# Patient Record
Sex: Male | Born: 2000 | Race: Black or African American | Hispanic: No | Marital: Single | Smoking: Never smoker
Health system: Southern US, Community
[De-identification: ages and names within clinical notes are randomized; demographics above are authoritative.]

## PROBLEM LIST (undated history)

## (undated) DIAGNOSIS — R040 Epistaxis: Secondary | ICD-10-CM

---
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
## 2020-12-12 ENCOUNTER — Emergency Department (HOSPITAL_BASED_OUTPATIENT_CLINIC_OR_DEPARTMENT_OTHER): Payer: BC Managed Care – PPO | Admitting: Radiology

## 2020-12-12 ENCOUNTER — Emergency Department (HOSPITAL_BASED_OUTPATIENT_CLINIC_OR_DEPARTMENT_OTHER)
Admission: EM | Admit: 2020-12-12 | Discharge: 2020-12-12 | Disposition: A | Discharge status: Home / Self Care | Payer: BC Managed Care – PPO | Attending: Emergency Medicine | Admitting: Emergency Medicine

## 2020-12-12 ENCOUNTER — Encounter (HOSPITAL_BASED_OUTPATIENT_CLINIC_OR_DEPARTMENT_OTHER): Payer: Self-pay | Admitting: Obstetrics and Gynecology

## 2020-12-12 ENCOUNTER — Other Ambulatory Visit: Payer: Self-pay

## 2020-12-12 DIAGNOSIS — R0789 Other chest pain: Secondary | ICD-10-CM | POA: Diagnosis not present

## 2020-12-12 DIAGNOSIS — R079 Chest pain, unspecified: Secondary | ICD-10-CM | POA: Diagnosis present

## 2020-12-12 LAB — BASIC METABOLIC PANEL
Anion gap: 10 (ref 5–15)
BUN: 12 mg/dL (ref 6–20)
CO2: 25 mmol/L (ref 22–32)
Calcium: 9.7 mg/dL (ref 8.9–10.3)
Chloride: 104 mmol/L (ref 98–111)
Creatinine, Ser: 0.84 mg/dL (ref 0.61–1.24)
GFR, Estimated: >60 mL/min (ref >60)
Glucose, Bld: 90 mg/dL (ref 70–99)
Potassium: 3.9 mmol/L (ref 3.5–5.1)
Sodium: 139 mmol/L (ref 135–145)

## 2020-12-12 LAB — CBC
HCT: 46.6 % (ref 39.0–52.0)
Hemoglobin: 15.5 g/dL (ref 13.0–17.0)
MCH: 29.2 pg (ref 26.0–34.0)
MCHC: 33.3 g/dL (ref 30.0–36.0)
MCV: 87.9 fL (ref 80.0–100.0)
Platelets: 235 10*3/uL (ref 150–400)
RBC: 5.3 MIL/uL (ref 4.22–5.81)
RDW: 12 % (ref 11.5–15.5)
WBC: 4.9 10*3/uL (ref 4.0–10.5)
nRBC: 0 % (ref 0.0–0.2)

## 2020-12-12 LAB — TROPONIN I (HIGH SENSITIVITY): Troponin I (High Sensitivity): <2 ng/L (ref <18)

## 2020-12-12 MED ORDER — KETOROLAC TROMETHAMINE 30 MG/ML IJ SOLN
30.0000 mg | Freq: Once | INTRAMUSCULAR | Status: AC
  Administered 2020-12-12: 30 mg via INTRAVENOUS
  Filled 2020-12-12: qty 1

## 2020-12-12 NOTE — ED Provider Notes (Signed)
[facility] EMERGENCY DEPT Provider Note   CSN: [id number] Arrival date & time: 12/12/20  1300     History Chief Complaint  Patient presents with  . Chest Pain    Mathew Nunez is a 20 y.o. male.   Chest Pain Pain location:  L chest Pain quality: aching   Pain radiates to:  Does not radiate Pain severity:  Mild Onset quality:  Gradual Timing:  Intermittent Progression:  Waxing and waning Chronicity:  New Context: raising an arm   Relieved by:  Nothing Worsened by:  Movement Associated symptoms: no abdominal pain, no anorexia, no anxiety, no back pain, no claudication, no cough, no dizziness, no fever, no headache, no heartburn, no palpitations, no shortness of breath and no vomiting   Risk factors: no coronary artery disease, no high cholesterol and no hypertension        Past Medical History:  Diagnosis Date  . Epistaxis     There are no problems to display for this patient.   History reviewed. No pertinent surgical history.     No family history on file.  Social History   Tobacco Use  . Smoking status: Never Smoker  . Smokeless tobacco: Never Used  Vaping Use  . Vaping Use: Never used  Substance Use Topics  . Alcohol use: No  . Drug use: Yes    Frequency: 3.0 times per week    Types: Marijuana    Comment: everyday    Home Medications Prior to Admission medications   Medication Sig Start Date End Date Taking? Authorizing Provider  cyclobenzaprine (FLEXERIL) 10 MG tablet Take 1 tablet (10 mg total) by mouth 2 (two) times daily as needed for muscle spasms. 06/01/19   Renne Crigler, PA-C  famotidine (PEPCID) 20 MG tablet Take 1 tablet (20 mg total) by mouth 2 (two) times daily. Patient not taking: Reported on 06/01/2019 10/31/17   Palumbo, April, MD  naproxen (NAPROSYN) 500 MG tablet Take 1 tablet (500 mg total) by mouth 2 (two) times daily. 06/01/19   Renne Crigler, PA-C    Allergies    Patient has no known  allergies.  Review of Systems   Review of Systems  Constitutional: Negative for chills and fever.  HENT: Negative for ear pain and sore throat.   Eyes: Negative for pain and visual disturbance.  Respiratory: Negative for cough and shortness of breath.   Cardiovascular: Positive for chest pain. Negative for palpitations and claudication.  Gastrointestinal: Negative for abdominal pain, anorexia, heartburn and vomiting.  Genitourinary: Negative for dysuria and hematuria.  Musculoskeletal: Negative for arthralgias and back pain.  Skin: Negative for color change and rash.  Neurological: Negative for dizziness, seizures, syncope and headaches.  All other systems reviewed and are negative.   Physical Exam Updated Vital Signs BP 112/82 (BP Location: Left Arm)   Pulse 69   Temp 98.6 F (37 C) (Oral)   Resp 14   Ht 5\' 9"  (1.753 m)   Wt 65.8 kg   SpO2 100%   BMI 21.41 kg/m   Physical Exam Vitals and nursing note reviewed.  Constitutional:      Appearance: He is well-developed.  HENT:     Head: Normocephalic and atraumatic.  Eyes:     Conjunctiva/sclera: Conjunctivae normal.     Pupils: Pupils are equal, round, and reactive to light.  Cardiovascular:     Rate and Rhythm: Normal rate and regular rhythm.     Pulses:  Radial pulses are 2+ on the right side and 2+ on the left side.     Heart sounds: No murmur heard.   Pulmonary:     Effort: Pulmonary effort is normal. No respiratory distress.     Breath sounds: Normal breath sounds. No decreased breath sounds or wheezing.  Chest:     Chest wall: Tenderness present.  Abdominal:     Palpations: Abdomen is soft.     Tenderness: There is no abdominal tenderness.  Musculoskeletal:        General: Normal range of motion.     Cervical back: Normal range of motion and neck supple.     Right lower leg: No edema.     Left lower leg: No edema.  Skin:    General: Skin is warm and dry.     Capillary Refill: Capillary refill  takes less than 2 seconds.  Neurological:     General: No focal deficit present.     Mental Status: He is alert.     ED Results / Procedures / Treatments   Labs (all labs ordered are listed, but only abnormal results are displayed) Labs Reviewed  BASIC METABOLIC PANEL  CBC  TROPONIN I (HIGH SENSITIVITY)    EKG EKG Interpretation  Date/Time:  Wednesday December 12 2020 13:10:43 EDT Ventricular Rate:  65 PR Interval:  146 QRS Duration: 92 QT Interval:  358 QTC Calculation: 372 R Axis:   68 Text Interpretation: Normal sinus rhythm Confirmed by Virgina Norfolk ([id number]) on 12/12/2020 3:01:56 PM   Radiology DG Chest 2 View  Result Date: 12/12/2020 CLINICAL DATA:  Left chest pain since Sunday with worsening. EXAM: CHEST - 2 VIEW COMPARISON:  None. FINDINGS: Trachea is midline. Heart size normal. Lungs are clear. No pleural fluid. IMPRESSION: Negative. Electronically Signed   By: Leanna Battles M.D.   On: 12/12/2020 13:53    Procedures Procedures   Medications Ordered in ED Medications  ketorolac (TORADOL) 30 MG/ML injection 30 mg (has no administration in time range)    ED Course  I have reviewed the triage vital signs and the nursing notes.  Pertinent labs & imaging results that were available during my care of the patient were reviewed by me and considered in my medical decision making (see chart for details).    MDM Rules/Calculators/A&P                          Mathew Nunez is here with left-sided chest pain.  Reproducible.  Normal vitals.  No fever.  Troponin normal.  EKG with no ischemic changes.  Chest x-ray with no rib fracture or pneumothorax.  No significant anemia, electrolyte abnormality, kidney injury.  Overall suspect musculoskeletal pain as he did a new workout over the weekend.  Given a dose of Toradol prior to discharge.  Recommend Tylenol and ibuprofen.  This chart was dictated using voice recognition software.  Despite best efforts to proofread,  errors  can occur which can change the documentation meaning.   Final Clinical Impression(s) / ED Diagnoses Final diagnoses:  Chest wall pain    Rx / DC Orders ED Discharge Orders    None       Virgina Norfolk, DO 12/12/20 1514

## 2020-12-12 NOTE — Discharge Instructions (Addendum)
Overall suspect musculoskeletal pain.  Recommend 800 mg ibuprofen every 8 hours as needed for pain.  Recommend 1000 mg of Tylenol every 6 hours as needed for pain.

## 2020-12-12 NOTE — ED Triage Notes (Signed)
Patient reports to the ER for chest pain. Patient reports he had some cardiac problems in school with skipping beats and SVT and had to stop playing sports for a bit. Patient reports he is unsure whether he had an echo or stress test done due to his age at the time.

## 2021-12-29 IMAGING — DX DG CHEST 2V
2 series · 2 of 2 positions shown · non-contrast
Comparison: None.

CLINICAL DATA: Left chest pain since [REDACTED] with worsening.

EXAM:
CHEST - 2 VIEW

[chest pa]
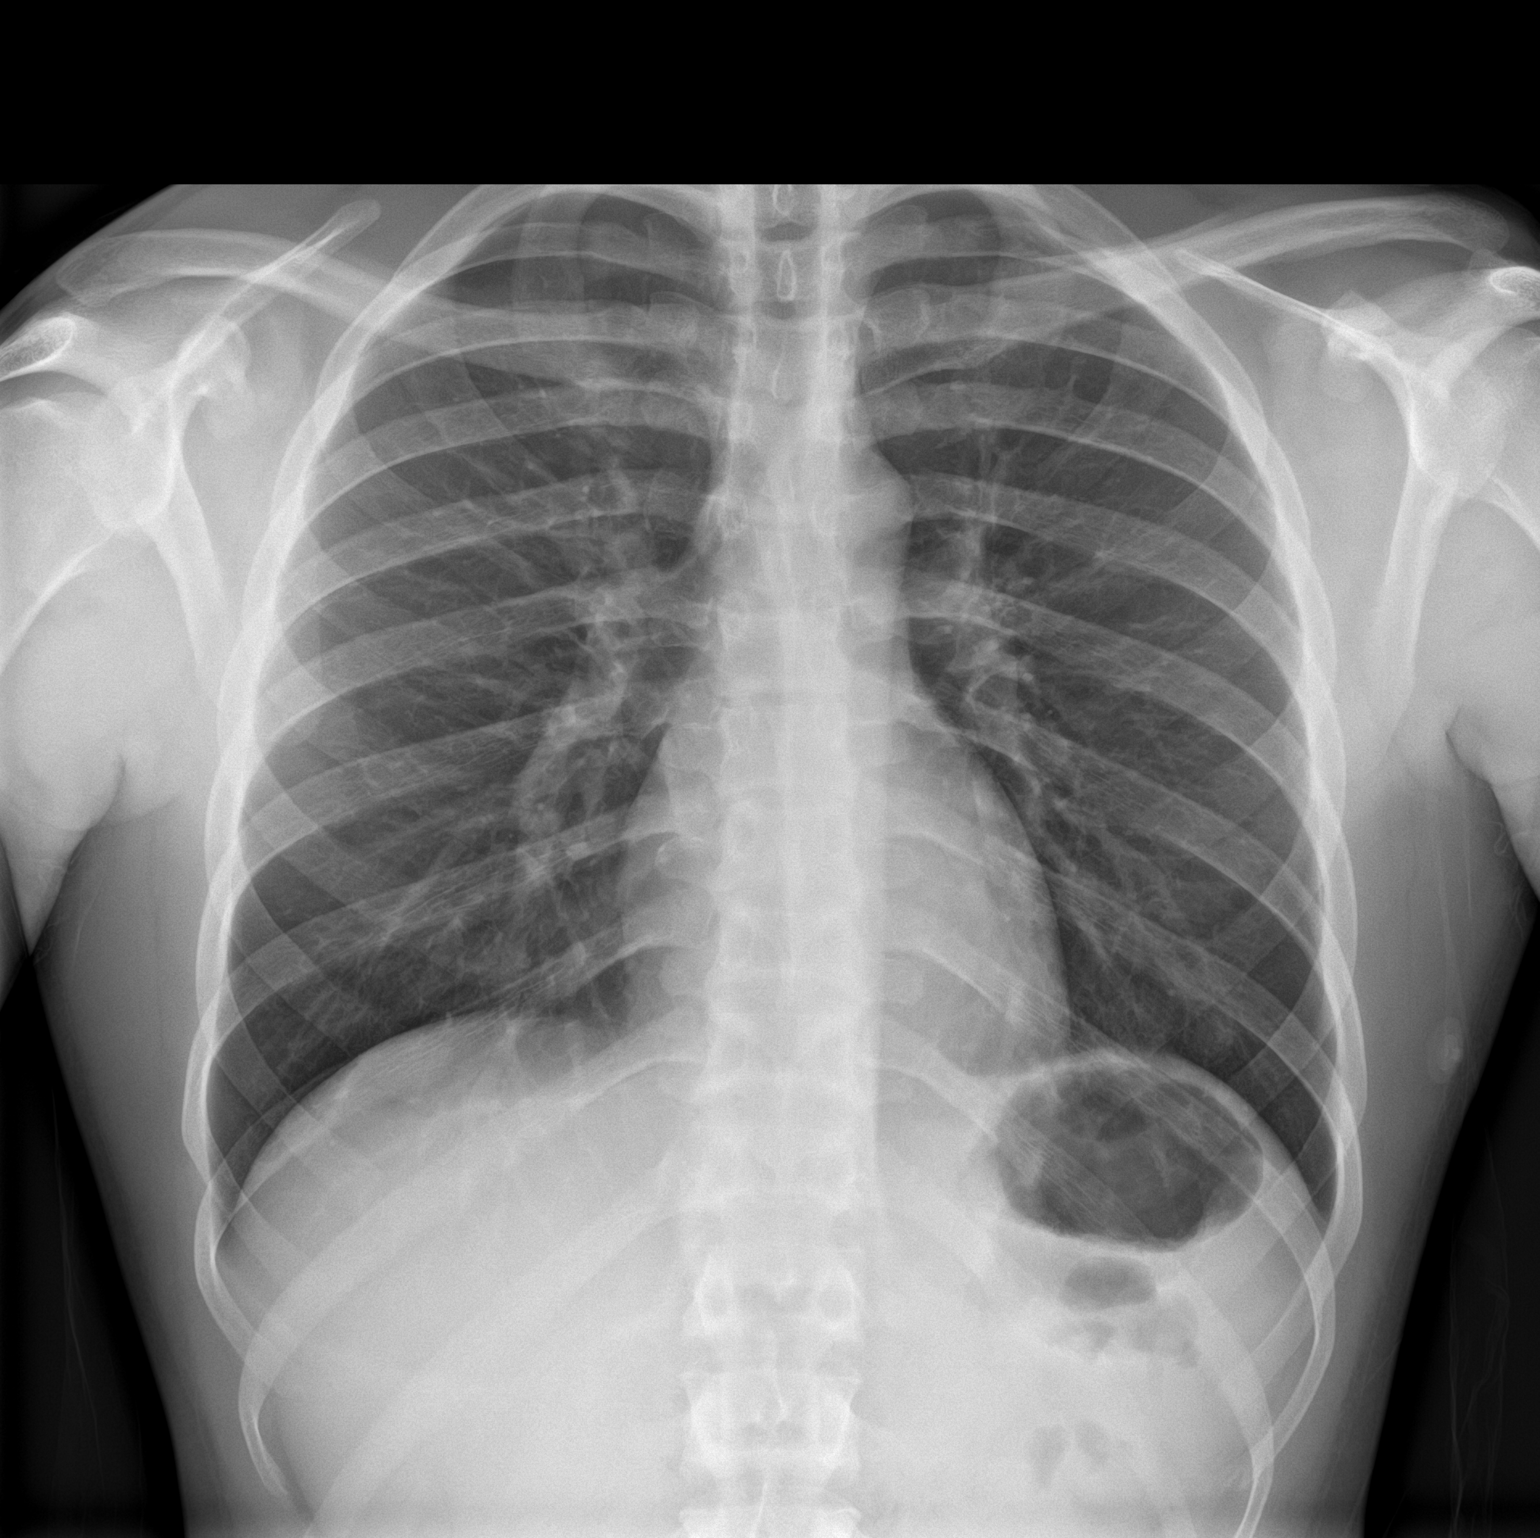

[chest lat]
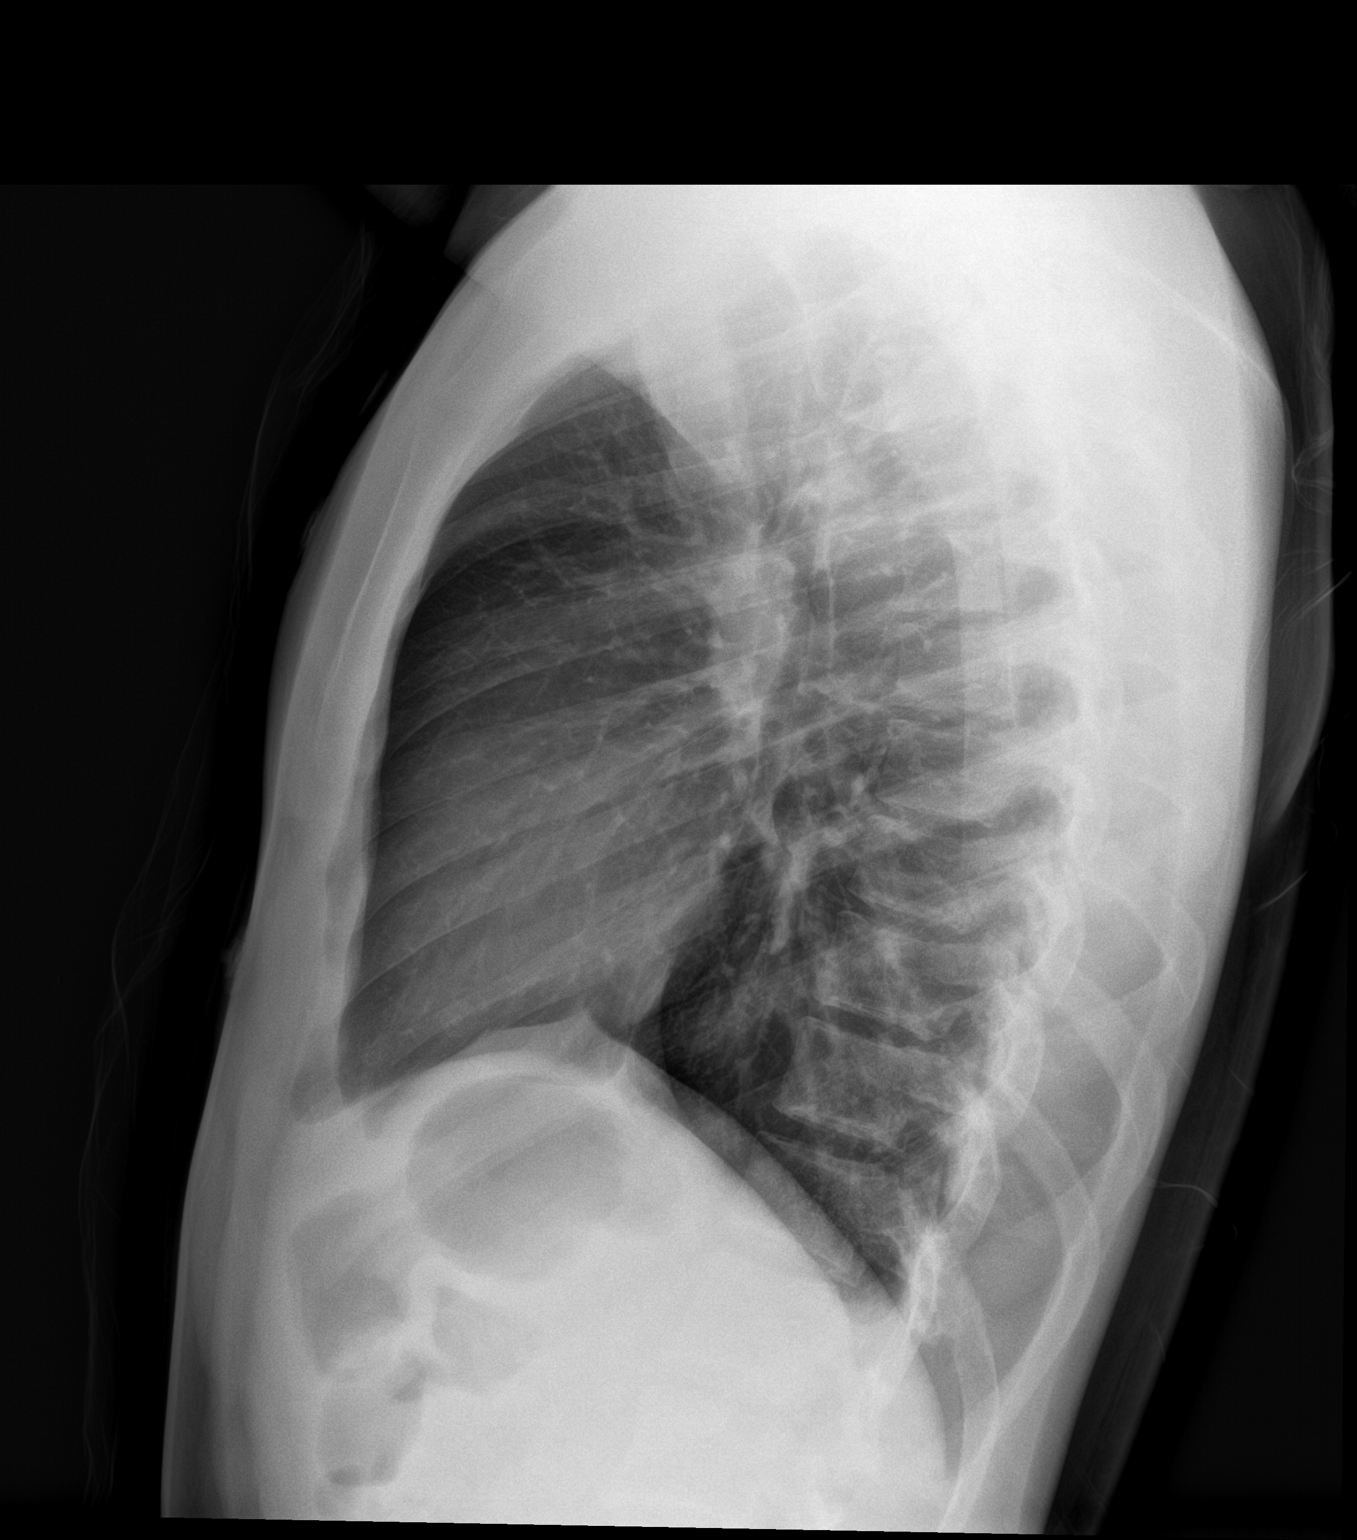

[2 of 2 positions shown; findings below may reference images not displayed]

FINDINGS: Trachea is midline. Heart size normal. Lungs are clear. No pleural
fluid.
IMPRESSION: Negative.

## 2022-01-28 ENCOUNTER — Emergency Department (HOSPITAL_BASED_OUTPATIENT_CLINIC_OR_DEPARTMENT_OTHER)
Admission: EM | Admit: 2022-01-28 | Discharge: 2022-01-28 | Disposition: A | Discharge status: Home / Self Care | Payer: Medicaid Other | Attending: Emergency Medicine | Admitting: Emergency Medicine

## 2022-01-28 ENCOUNTER — Other Ambulatory Visit: Payer: Self-pay

## 2022-01-28 ENCOUNTER — Encounter (HOSPITAL_BASED_OUTPATIENT_CLINIC_OR_DEPARTMENT_OTHER): Payer: Self-pay | Admitting: Obstetrics and Gynecology

## 2022-01-28 DIAGNOSIS — U071 COVID-19: Secondary | ICD-10-CM | POA: Diagnosis not present

## 2022-01-28 DIAGNOSIS — R519 Headache, unspecified: Secondary | ICD-10-CM | POA: Diagnosis present

## 2022-01-28 LAB — COMPREHENSIVE METABOLIC PANEL
ALT: 23 U/L (ref 0–44)
AST: 22 U/L (ref 15–41)
Albumin: 4.6 g/dL (ref 3.5–5.0)
Alkaline Phosphatase: 59 U/L (ref 38–126)
Anion gap: 7 (ref 5–15)
BUN: 8 mg/dL (ref 6–20)
CO2: 27 mmol/L (ref 22–32)
Calcium: 9.8 mg/dL (ref 8.9–10.3)
Chloride: 96 mmol/L — ABNORMAL LOW (ref 98–111)
Creatinine, Ser: 1.24 mg/dL (ref 0.61–1.24)
GFR, Estimated: >60 mL/min (ref >60)
Glucose, Bld: 90 mg/dL (ref 70–99)
Potassium: 4.3 mmol/L (ref 3.5–5.1)
Sodium: 130 mmol/L — ABNORMAL LOW (ref 135–145)
Total Bilirubin: 0.5 mg/dL (ref 0.3–1.2)
Total Protein: 7.6 g/dL (ref 6.5–8.1)

## 2022-01-28 LAB — URINALYSIS, ROUTINE W REFLEX MICROSCOPIC
Bilirubin Urine: NEGATIVE
Glucose, UA: NEGATIVE mg/dL
Hgb urine dipstick: NEGATIVE
Ketones, ur: 40 mg/dL — AB
Leukocytes,Ua: NEGATIVE
Nitrite: NEGATIVE
Protein, ur: 30 mg/dL — AB
Specific Gravity, Urine: 1.028 (ref 1.005–1.030)
pH: 7.5 (ref 5.0–8.0)

## 2022-01-28 LAB — CBC WITH DIFFERENTIAL/PLATELET
Abs Immature Granulocytes: 0.01 10*3/uL (ref 0.00–0.07)
Basophils Absolute: 0 10*3/uL (ref 0.0–0.1)
Basophils Relative: 0 %
Eosinophils Absolute: 0 10*3/uL (ref 0.0–0.5)
Eosinophils Relative: 0 %
HCT: 47.4 % (ref 39.0–52.0)
Hemoglobin: 16.2 g/dL (ref 13.0–17.0)
Immature Granulocytes: 0 %
Lymphocytes Relative: 5 %
Lymphs Abs: 0.3 10*3/uL — ABNORMAL LOW (ref 0.7–4.0)
MCH: 30.1 pg (ref 26.0–34.0)
MCHC: 34.2 g/dL (ref 30.0–36.0)
MCV: 88.1 fL (ref 80.0–100.0)
Monocytes Absolute: 0.8 10*3/uL (ref 0.1–1.0)
Monocytes Relative: 14 %
Neutro Abs: 4.6 10*3/uL (ref 1.7–7.7)
Neutrophils Relative %: 81 %
Platelets: 197 10*3/uL (ref 150–400)
RBC: 5.38 MIL/uL (ref 4.22–5.81)
RDW: 12 % (ref 11.5–15.5)
WBC: 5.7 10*3/uL (ref 4.0–10.5)
nRBC: 0 % (ref 0.0–0.2)

## 2022-01-28 LAB — RESP PANEL BY RT-PCR (FLU A&B, COVID) ARPGX2
Influenza A by PCR: NEGATIVE
Influenza B by PCR: NEGATIVE
SARS Coronavirus 2 by RT PCR: POSITIVE — AB

## 2022-01-28 LAB — GROUP A STREP BY PCR: Group A Strep by PCR: NOT DETECTED

## 2022-01-28 MED ORDER — KETOROLAC TROMETHAMINE 15 MG/ML IJ SOLN
15.0000 mg | Freq: Once | INTRAMUSCULAR | Status: AC
  Administered 2022-01-28: 15 mg via INTRAVENOUS
  Filled 2022-01-28: qty 1

## 2022-01-28 MED ORDER — LACTATED RINGERS IV BOLUS
1000.0000 mL | Freq: Once | INTRAVENOUS | Status: AC
  Administered 2022-01-28: 1000 mL via INTRAVENOUS

## 2022-01-28 MED ORDER — OXYCODONE-ACETAMINOPHEN 5-325 MG PO TABS
1.0000 | ORAL_TABLET | ORAL | Status: DC | PRN
  Administered 2022-01-28: 1 via ORAL
  Filled 2022-01-28: qty 1

## 2022-01-28 MED ORDER — METOCLOPRAMIDE HCL 5 MG/ML IJ SOLN
10.0000 mg | Freq: Once | INTRAMUSCULAR | Status: AC
  Administered 2022-01-28: 10 mg via INTRAVENOUS
  Filled 2022-01-28: qty 2

## 2022-01-28 MED ORDER — ONDANSETRON 4 MG PO TBDP
4.0000 mg | ORAL_TABLET | Freq: Once | ORAL | Status: AC | PRN
  Administered 2022-01-28: 4 mg via ORAL
  Filled 2022-01-28: qty 1

## 2022-01-28 MED ORDER — ONDANSETRON HCL 4 MG PO TABS: 4.0000 mg | ORAL_TABLET | Freq: Four times a day (QID) | ORAL | 0 refills | Status: DC

## 2022-01-28 MED ORDER — DEXAMETHASONE SODIUM PHOSPHATE 10 MG/ML IJ SOLN
10.0000 mg | Freq: Once | INTRAMUSCULAR | Status: AC
  Administered 2022-01-28: 10 mg via INTRAVENOUS
  Filled 2022-01-28: qty 1

## 2022-01-28 NOTE — ED Provider Notes (Signed)
[facility] EMERGENCY DEPT Provider Note   CSN: [id number] Arrival date & time: 01/28/22  1335     History Chief Complaint  Patient presents with   Headache   Emesis    Mathew Nunez is a 21 y.o. male with history of hyponatremia presents to the emergency department for evaluation of headache, body aches, nausea, vomiting, chills, rhinorrhea since around last night/0700 this morning.  Patient reports that he was out this weekend partying with friends and family.  He does not think that he is hung over as he has felt well after the partying.  He reports that he has a diffuse headache as well as diffuse body aches.  He denies any neck pain or neck stiffness.  Denies any fever, nasal congestion, sore throat, abdominal pain, chest pain, shortness of breath, or cough.  He took some Benadryl prior to arrival but denies trying any other medications.  The patient reports he feels diffusely fatigued.  He denies any blurry vision.  Denies that this is the worst headache of his life.  He denies any other medical conditions, denies any surgeries.  No daily medications.  No known drug allergies.  Daily marijuana user.   Headache Associated symptoms: fatigue, myalgias, nausea and vomiting   Associated symptoms: no abdominal pain, no back pain, no congestion, no cough, no diarrhea, no dizziness, no fever, no neck pain and no sore throat   Emesis Associated symptoms: chills, headaches and myalgias   Associated symptoms: no abdominal pain, no cough, no diarrhea, no fever and no sore throat        Home Medications Prior to Admission medications   Medication Sig Start Date End Date Taking? Authorizing Provider  ondansetron (ZOFRAN) 4 MG tablet Take 1 tablet (4 mg total) by mouth every 6 (six) hours. 01/28/22  Yes Achille Rich, PA-C  cyclobenzaprine (FLEXERIL) 10 MG tablet Take 1 tablet (10 mg total) by mouth 2 (two) times daily as needed for muscle spasms. 06/01/19   Renne Crigler,  PA-C  famotidine (PEPCID) 20 MG tablet Take 1 tablet (20 mg total) by mouth 2 (two) times daily. Patient not taking: Reported on 06/01/2019 10/31/17   Palumbo, April, MD  naproxen (NAPROSYN) 500 MG tablet Take 1 tablet (500 mg total) by mouth 2 (two) times daily. 06/01/19   Renne Crigler, PA-C      Allergies    Patient has no known allergies.    Review of Systems   Review of Systems  Constitutional:  Positive for chills and fatigue. Negative for fever.  HENT:  Positive for rhinorrhea. Negative for congestion and sore throat.   Respiratory:  Negative for cough and shortness of breath.   Cardiovascular:  Negative for chest pain.  Gastrointestinal:  Positive for nausea and vomiting. Negative for abdominal pain, constipation and diarrhea.  Musculoskeletal:  Positive for myalgias. Negative for back pain and neck pain.  Neurological:  Positive for headaches. Negative for dizziness, syncope and light-headedness.    Physical Exam Updated Vital Signs BP 121/74 (BP Location: Right Arm)   Pulse 81   Temp 98.1 F (36.7 C) (Oral)   Resp 16   Ht 5\' 9"  (1.753 m)   Wt 74.8 kg   SpO2 99%   BMI 24.37 kg/m  Physical Exam Vitals and nursing note reviewed.  Constitutional:      General: He is not in acute distress.    Appearance: He is not ill-appearing or toxic-appearing.  HENT:     Head: Normocephalic and atraumatic.  Right Ear: Tympanic membrane, ear canal and external ear normal.     Left Ear: Tympanic membrane, ear canal and external ear normal.     Nose:     Comments: Bilateral nasal turbinate edema and erythema with scant clear nasal discharge.    Mouth/Throat:     Mouth: Mucous membranes are moist.     Comments: No pharyngeal erythema, exudate, or edema noted.  Uvula midline.  Airway patent.  Moist mucous membranes. Eyes:     General: No scleral icterus.    Extraocular Movements: Extraocular movements intact.     Conjunctiva/sclera: Conjunctivae normal.     Pupils: Pupils are  equal, round, and reactive to light.  Neck:     Comments: Full range of motion without pain.  No cervical lymphadenopathy. Cardiovascular:     Rate and Rhythm: Normal rate and regular rhythm.  Pulmonary:     Effort: Pulmonary effort is normal. No respiratory distress.     Breath sounds: Normal breath sounds. No wheezing.     Comments: Clear to auscultation bilaterally.  No respiratory distress, accessory muscle use, nasal flaring, cyanosis, or tripoding present.  Patient is speaking in full sentences with ease and is satting well on room air without any increased work of breathing. Abdominal:     General: Abdomen is flat. Bowel sounds are normal. There is no distension.     Palpations: Abdomen is soft.     Tenderness: There is no abdominal tenderness. There is no guarding.  Musculoskeletal:        General: No swelling or deformity.     Cervical back: Normal range of motion and neck supple. No rigidity.  Lymphadenopathy:     Cervical: No cervical adenopathy.  Skin:    Findings: No rash.  Neurological:     General: No focal deficit present.     Mental Status: He is alert. Mental status is at baseline.     GCS: GCS eye subscore is 4. GCS verbal subscore is 5. GCS motor subscore is 6.     Cranial Nerves: No cranial nerve deficit, dysarthria or facial asymmetry.     Sensory: No sensory deficit.     Motor: Motor function is intact. No weakness or pronator drift.     Coordination: Finger-Nose-Finger Test normal.     ED Results / Procedures / Treatments   Labs (all labs ordered are listed, but only abnormal results are displayed) Labs Reviewed  RESP PANEL BY RT-PCR (FLU A&B, COVID) ARPGX2 - Abnormal; Notable for the following components:      Result Value   SARS Coronavirus 2 by RT PCR POSITIVE (*)    All other components within normal limits  CBC WITH DIFFERENTIAL/PLATELET - Abnormal; Notable for the following components:   Lymphs Abs 0.3 (*)    All other components within normal  limits  URINALYSIS, ROUTINE W REFLEX MICROSCOPIC - Abnormal; Notable for the following components:   Ketones, ur 40 (*)    Protein, ur 30 (*)    All other components within normal limits  COMPREHENSIVE METABOLIC PANEL - Abnormal; Notable for the following components:   Sodium 130 (*)    Chloride 96 (*)    All other components within normal limits  GROUP A STREP BY PCR    EKG None  Radiology No results found.  Procedures Procedures   Medications Ordered in ED Medications  ondansetron (ZOFRAN-ODT) disintegrating tablet 4 mg (4 mg Oral Given 01/28/22 1401)  ketorolac (TORADOL) 15 MG/ML injection 15 mg (  15 mg Intravenous Given 01/28/22 1742)  metoCLOPramide (REGLAN) injection 10 mg (10 mg Intravenous Given 01/28/22 1744)  dexamethasone (DECADRON) injection 10 mg (10 mg Intravenous Given 01/28/22 1738)  lactated ringers bolus 1,000 mL (0 mLs Intravenous Stopped 01/28/22 1839)    ED Course/ Medical Decision Making/ A&P                           Medical Decision Making Amount and/or Complexity of Data Reviewed Labs: ordered.  Risk Prescription drug management.    21 year old male presents the emergency department for evaluation of headache, nausea, vomiting, body aches, and chills since last night/early this morning.  Differential diagnosis includes but is not limited to viral illness, COVID, flu, gastroenteritis, meningitis, migraine, bad headache, subarachnoid hemorrhage, electrolyte abnormality, dehydration, cannabinoid induced hyperemesis syndrome.  Vital signs are unremarkable.  Physical exam as listed above.  Lungs are clear.  No pharyngeal erythema, edema, or exudate.  Moist mucous membranes.  No abdominal tenderness palpation.  Labs ordered.  I independently reviewed and interpreted the patient's labs.  Patient's positive for COVID, negative for flu.  Negative for strep.  CBC shows no leukocytosis or anemia.  Urinalysis shows ketones and protein in the urine.  CMP shows  hyponatremia at 130 as well as decreased chloride 96.  No other electrolyte or LFT abnormality.  When discussing labs with the patient and family member at bedside, his mother mentions that he has a history of low sodium throughout his childhood and they are unable to find out what the root causes, but they are following at the primary care doctor.  With his ketones in probable dehydration from alcohol use this weekend, did order him 1 L of LR in addition to a migraine cocktail including Reglan, Toradol, and Decadron.  He was also given Zofran in triage.  Patient has not had any emesis.  P.o. challenge passed.  I likely think his symptoms are related to his COVID.  I doubt any gastroenteritis as patient is not having abdominal pain, diarrhea, or constipation.  Doubt meningitis given patient's full range of motion of his neck without any other meningeal signs.  Doubt any subarachnoid hemorrhage given that his pain was relieved with migraine cocktail and story not consistent with subarachnoid hemorrhage.  On reevaluation, patient reports that his headache is feeling much better after the migraine cocktail.  He reports that he has had COVID 3 other times for this and this does feel very similar to how he was before.  We discussed the use of antivirals.  Discussed the risk and benefits.  Patient would like to forego antiviral use and would like to use over-the-counter topical medication to begin with.  I think this is reasonable.  We will give him work note.  We will send home with Zofran if he has any repeat episodes of nausea and vomiting.  We discussed supportive care measures.  We discussed strict return precautions and red flag symptoms.  Patient verbalized understanding and agrees to the plan.  Patient is stable and being discharged home in good condition.   Final Clinical Impression(s) / ED Diagnoses Final diagnoses:  COVID    Rx / DC Orders ED Discharge Orders          Ordered     ondansetron (ZOFRAN) 4 MG tablet  Every 6 hours        01/28/22 2115              Norval Gable,  Victory Dakin, PA-C 01/30/22 1058    Tanda Rockers A, DO 02/01/22 1510

## 2022-01-28 NOTE — ED Notes (Signed)
Discharge paperwork given and understood. 

## 2022-01-28 NOTE — ED Triage Notes (Signed)
Patient reports to the ER for headache and nausea with emesis. Patient reports he started having sinus pressure last night and woke up at 0700 this morning feeling bad

## 2022-01-28 NOTE — ED Notes (Signed)
Woke up this morning with a headache and N/V. Headache back and left side of head. Originally started last night with what was believed to be a sinus pressure issue. No other pain or illness to note.

## 2022-01-28 NOTE — Discharge Instructions (Addendum)
You were seen here today for evaluation of your fatigue and body aches.  It was discovered that you tested positive for COVID which is likely the source of your symptoms.  Please take over-the-counter cough and cold medication for this.  I have attached additional information on COVID for you read at your own pace.  I included a work note for you to return.  If you have any concern, new or worsening symptoms, please return to the nearest emergency department for reevaluation.

## 2022-01-28 NOTE — ED Notes (Signed)
Fluid challenge passed.  

## 2022-07-14 ENCOUNTER — Encounter (HOSPITAL_COMMUNITY): Payer: Self-pay

## 2022-07-14 ENCOUNTER — Ambulatory Visit (HOSPITAL_COMMUNITY)
Admission: RE | Admit: 2022-07-14 | Discharge: 2022-07-14 | Disposition: A | Discharge status: Home / Self Care | Payer: Medicaid Other | Source: Ambulatory Visit | Attending: Emergency Medicine | Admitting: Emergency Medicine

## 2022-07-14 VITALS — BP 129/77 | HR 59 | Temp 98.4°F | Resp 18

## 2022-07-14 DIAGNOSIS — Z113 Encounter for screening for infections with a predominantly sexual mode of transmission: Secondary | ICD-10-CM | POA: Insufficient documentation

## 2022-07-14 DIAGNOSIS — R300 Dysuria: Secondary | ICD-10-CM | POA: Diagnosis present

## 2022-07-14 LAB — POCT URINALYSIS DIPSTICK, ED / UC
Bilirubin Urine: NEGATIVE
Glucose, UA: NEGATIVE mg/dL
Ketones, ur: NEGATIVE mg/dL
Leukocytes,Ua: NEGATIVE
Nitrite: NEGATIVE
Protein, ur: NEGATIVE mg/dL
Specific Gravity, Urine: 1.025 (ref 1.005–1.030)
Urobilinogen, UA: 1 mg/dL (ref 0.0–1.0)
pH: 5.5 (ref 5.0–8.0)

## 2022-07-14 NOTE — ED Triage Notes (Signed)
Pt requesting STD testing. States has had burning on urination off and on x3 days.

## 2022-07-14 NOTE — ED Provider Notes (Signed)
[place]    CSN: [id number] Arrival date & time: 07/14/22  1422      History   Chief Complaint Chief Complaint  Patient presents with   Exposure to STD    Burning sensation while urinating - Entered by patient   SEXUALLY TRANSMITTED DISEASE    HPI Mathew Nunez is a 22 y.o. male.  Presents with 2 day history of dysuria Felt it Saturday and Sunday, but mostly resolved over the last 2 days No hematuria. Denies penile discharge, rash, lesions, irritation. No abdominal pain or vomiting. Has been drinking lots of water  Reports unprotected intercourse in the last several weeks No known exposure to STD but would like testing   Past Medical History:  Diagnosis Date   Epistaxis     There are no problems to display for this patient.   History reviewed. No pertinent surgical history.   Home Medications    Prior to Admission medications   Not on File    Family History Family History  Problem Relation Age of Onset   Hypertension Mother     Social History Social History   Tobacco Use   Smoking status: Never   Smokeless tobacco: Never  Vaping Use   Vaping Use: Never used  Substance Use Topics   Alcohol use: Yes   Drug use: Yes    Frequency: 3.0 times per week    Types: Marijuana    Comment: everyday     Allergies   Patient has no known allergies.   Review of Systems Review of Systems As per HPI  Physical Exam Triage Vital Signs ED Triage Vitals [07/14/22 1500]  Enc Vitals Group     BP 129/77     Pulse Rate (!) 59     Resp 18     Temp 98.4 F (36.9 C)     Temp Source Oral     SpO2 99 %     Weight      Height      Head Circumference      Peak Flow      Pain Score 0     Pain Loc      Pain Edu?      Excl. in [place]?    No data found.  Updated Vital Signs BP 129/77 (BP Location: Left Arm)   Pulse (!) 59   Temp 98.4 F (36.9 C) (Oral)   Resp 18   SpO2 99%    Physical Exam Vitals and nursing note reviewed.   Constitutional:      General: He is not in acute distress.    Appearance: Normal appearance.  HENT:     Mouth/Throat:     Pharynx: Oropharynx is clear.  Cardiovascular:     Rate and Rhythm: Normal rate and regular rhythm.     Pulses: Normal pulses.  Pulmonary:     Effort: Pulmonary effort is normal.  Neurological:     Mental Status: He is alert and oriented to person, place, and time.     UC Treatments / Results  Labs (all labs ordered are listed, but only abnormal results are displayed) Labs Reviewed  POCT URINALYSIS DIPSTICK, ED / UC - Abnormal; Notable for the following components:      Result Value   Hgb urine dipstick TRACE (*)    All other components within normal limits  CYTOLOGY, (ORAL, ANAL, URETHRAL) ANCILLARY ONLY    EKG  Radiology No results found.  Procedures Procedures   Medications  Ordered in UC Medications - No data to display  Initial Impression / Assessment and Plan / UC Course  I have reviewed the triage vital signs and the nursing notes.  Pertinent labs & imaging results that were available during my care of the patient were reviewed by me and considered in my medical decision making (see chart for details).  UA with trace hgb but no sign of infection, no leuks. No need for culture.  Reassuring symptoms have mostly improved Consider STD etiology vs concentrated urine He is drinking lots of fluids, will continue this Cyto swab pending, will treat positive result if needed. Discussed safe sex practices. Return precautions discussed. Patient agrees to plan  Final Clinical Impressions(s) / UC Diagnoses   Final diagnoses:  Dysuria  Screen for STD (sexually transmitted disease)     Discharge Instructions      Urine looks good! We will call you if anything on your swab returns positive. Please abstain from sexual intercourse until your results return.  Increase fluids as much as tolerated. Please return with any concerns.     ED  Prescriptions   None    PDMP not reviewed this encounter.   Marlow Baars, [place] 07/14/22 1541

## 2022-07-14 NOTE — Discharge Instructions (Signed)
Urine looks good! We will call you if anything on your swab returns positive. Please abstain from sexual intercourse until your results return.  Increase fluids as much as tolerated. Please return with any concerns.

## 2022-07-15 LAB — CYTOLOGY, (ORAL, ANAL, URETHRAL) ANCILLARY ONLY
Chlamydia: NEGATIVE
Comment: NEGATIVE
Comment: NEGATIVE
Comment: NORMAL
Neisseria Gonorrhea: NEGATIVE
Trichomonas: NEGATIVE
